# Patient Record
Sex: Male | Born: 1945
Health system: Southern US, Community
[De-identification: ages and names within clinical notes are randomized; demographics above are authoritative.]

## PROBLEM LIST (undated history)

## (undated) DIAGNOSIS — C61 Malignant neoplasm of prostate: Secondary | ICD-10-CM

## (undated) HISTORY — PX: APPENDECTOMY: SHX54

## (undated) HISTORY — PX: VASECTOMY: SHX75

## (undated) HISTORY — PX: HERNIA REPAIR: SHX51

## (undated) HISTORY — PX: PROSTATE BIOPSY: SHX241

## (undated) HISTORY — PX: COLONOSCOPY W/ POLYPECTOMY: SHX1380

---

## 1998-08-14 ENCOUNTER — Other Ambulatory Visit: Admission: RE | Admit: 1998-08-14 | Discharge: 1998-08-14 | Payer: Self-pay | Admitting: Internal Medicine

## 1998-11-13 ENCOUNTER — Ambulatory Visit (HOSPITAL_COMMUNITY): Admission: RE | Admit: 1998-11-13 | Discharge: 1998-11-13 | Payer: Self-pay | Admitting: Gastroenterology

## 2003-12-04 ENCOUNTER — Ambulatory Visit (HOSPITAL_COMMUNITY): Admission: RE | Admit: 2003-12-04 | Discharge: 2003-12-04 | Payer: Self-pay | Admitting: Gastroenterology

## 2005-11-12 ENCOUNTER — Ambulatory Visit (HOSPITAL_BASED_OUTPATIENT_CLINIC_OR_DEPARTMENT_OTHER): Admission: RE | Admit: 2005-11-12 | Discharge: 2005-11-12 | Payer: Self-pay | Admitting: Surgery

## 2011-11-11 DIAGNOSIS — Z1331 Encounter for screening for depression: Secondary | ICD-10-CM | POA: Diagnosis not present

## 2011-11-11 DIAGNOSIS — H6120 Impacted cerumen, unspecified ear: Secondary | ICD-10-CM | POA: Diagnosis not present

## 2011-11-11 DIAGNOSIS — R351 Nocturia: Secondary | ICD-10-CM | POA: Diagnosis not present

## 2011-11-11 DIAGNOSIS — N400 Enlarged prostate without lower urinary tract symptoms: Secondary | ICD-10-CM | POA: Diagnosis not present

## 2011-11-11 DIAGNOSIS — M1710 Unilateral primary osteoarthritis, unspecified knee: Secondary | ICD-10-CM | POA: Diagnosis not present

## 2011-11-11 DIAGNOSIS — Z79899 Other long term (current) drug therapy: Secondary | ICD-10-CM | POA: Diagnosis not present

## 2011-11-11 DIAGNOSIS — Z0000 Encounter for general adult medical examination without abnormal findings: Secondary | ICD-10-CM | POA: Diagnosis not present

## 2012-10-16 DIAGNOSIS — D1801 Hemangioma of skin and subcutaneous tissue: Secondary | ICD-10-CM | POA: Diagnosis not present

## 2012-10-16 DIAGNOSIS — L821 Other seborrheic keratosis: Secondary | ICD-10-CM | POA: Diagnosis not present

## 2012-10-16 DIAGNOSIS — L723 Sebaceous cyst: Secondary | ICD-10-CM | POA: Diagnosis not present

## 2012-11-16 DIAGNOSIS — Z79899 Other long term (current) drug therapy: Secondary | ICD-10-CM | POA: Diagnosis not present

## 2012-11-16 DIAGNOSIS — Z1331 Encounter for screening for depression: Secondary | ICD-10-CM | POA: Diagnosis not present

## 2012-11-16 DIAGNOSIS — R351 Nocturia: Secondary | ICD-10-CM | POA: Diagnosis not present

## 2012-11-16 DIAGNOSIS — M1710 Unilateral primary osteoarthritis, unspecified knee: Secondary | ICD-10-CM | POA: Diagnosis not present

## 2012-11-16 DIAGNOSIS — N400 Enlarged prostate without lower urinary tract symptoms: Secondary | ICD-10-CM | POA: Diagnosis not present

## 2012-11-16 DIAGNOSIS — Z0000 Encounter for general adult medical examination without abnormal findings: Secondary | ICD-10-CM | POA: Diagnosis not present

## 2012-12-06 DIAGNOSIS — L723 Sebaceous cyst: Secondary | ICD-10-CM | POA: Diagnosis not present

## 2012-12-22 DIAGNOSIS — H5210 Myopia, unspecified eye: Secondary | ICD-10-CM | POA: Diagnosis not present

## 2013-11-21 DIAGNOSIS — N400 Enlarged prostate without lower urinary tract symptoms: Secondary | ICD-10-CM | POA: Diagnosis not present

## 2013-11-21 DIAGNOSIS — Z1331 Encounter for screening for depression: Secondary | ICD-10-CM | POA: Diagnosis not present

## 2013-11-21 DIAGNOSIS — M1710 Unilateral primary osteoarthritis, unspecified knee: Secondary | ICD-10-CM | POA: Diagnosis not present

## 2013-11-21 DIAGNOSIS — Z23 Encounter for immunization: Secondary | ICD-10-CM | POA: Diagnosis not present

## 2013-11-21 DIAGNOSIS — Z0000 Encounter for general adult medical examination without abnormal findings: Secondary | ICD-10-CM | POA: Diagnosis not present

## 2013-11-21 DIAGNOSIS — R351 Nocturia: Secondary | ICD-10-CM | POA: Diagnosis not present

## 2013-11-21 DIAGNOSIS — IMO0002 Reserved for concepts with insufficient information to code with codable children: Secondary | ICD-10-CM | POA: Diagnosis not present

## 2014-02-14 ENCOUNTER — Other Ambulatory Visit: Payer: Self-pay | Admitting: Gastroenterology

## 2014-05-09 ENCOUNTER — Encounter (HOSPITAL_COMMUNITY): Payer: Self-pay | Admitting: *Deleted

## 2014-05-13 ENCOUNTER — Other Ambulatory Visit: Payer: Self-pay | Admitting: Gastroenterology

## 2014-05-28 ENCOUNTER — Encounter (HOSPITAL_COMMUNITY)
Admission: RE | Disposition: A | Discharge status: Home / Self Care | Payer: Self-pay | Source: Ambulatory Visit | Attending: Gastroenterology

## 2014-05-28 ENCOUNTER — Encounter (HOSPITAL_COMMUNITY): Payer: Self-pay | Admitting: Certified Registered Nurse Anesthetist

## 2014-05-28 ENCOUNTER — Ambulatory Visit (HOSPITAL_COMMUNITY): Payer: Medicare Other | Admitting: Anesthesiology

## 2014-05-28 ENCOUNTER — Ambulatory Visit (HOSPITAL_COMMUNITY)
Admission: RE | Admit: 2014-05-28 | Discharge: 2014-05-28 | Disposition: A | Discharge status: Home / Self Care | Payer: Medicare Other | Source: Ambulatory Visit | Attending: Gastroenterology | Admitting: Gastroenterology

## 2014-05-28 DIAGNOSIS — N400 Enlarged prostate without lower urinary tract symptoms: Secondary | ICD-10-CM | POA: Insufficient documentation

## 2014-05-28 DIAGNOSIS — Z1211 Encounter for screening for malignant neoplasm of colon: Secondary | ICD-10-CM | POA: Insufficient documentation

## 2014-05-28 DIAGNOSIS — K635 Polyp of colon: Secondary | ICD-10-CM | POA: Insufficient documentation

## 2014-05-28 DIAGNOSIS — M1990 Unspecified osteoarthritis, unspecified site: Secondary | ICD-10-CM | POA: Insufficient documentation

## 2014-05-28 DIAGNOSIS — Z86010 Personal history of colonic polyps: Secondary | ICD-10-CM | POA: Diagnosis not present

## 2014-05-28 DIAGNOSIS — D125 Benign neoplasm of sigmoid colon: Secondary | ICD-10-CM | POA: Diagnosis not present

## 2014-05-28 HISTORY — PX: COLONOSCOPY WITH PROPOFOL: SHX5780

## 2014-05-28 SURGERY — COLONOSCOPY WITH PROPOFOL
Anesthesia: Monitor Anesthesia Care

## 2014-05-28 MED ORDER — SODIUM CHLORIDE 0.9 % IV SOLN: INTRAVENOUS | Status: DC

## 2014-05-28 MED ORDER — GLYCOPYRROLATE 0.2 MG/ML IJ SOLN
INTRAMUSCULAR | Status: DC | PRN
  Administered 2014-05-28: 0.2 mg via INTRAVENOUS

## 2014-05-28 MED ORDER — LACTATED RINGERS IV SOLN
INTRAVENOUS | Status: DC
Start: 2014-05-28 — End: 2014-05-28
  Administered 2014-05-28: 1000 mL via INTRAVENOUS

## 2014-05-28 MED ORDER — PROPOFOL 10 MG/ML IV BOLUS
INTRAVENOUS | Status: AC
  Filled 2014-05-28: qty 20

## 2014-05-28 MED ORDER — PROPOFOL INFUSION 10 MG/ML OPTIME
INTRAVENOUS | Status: DC | PRN
  Administered 2014-05-28: 300 ug/kg/min via INTRAVENOUS

## 2014-05-28 MED ORDER — LIDOCAINE HCL (CARDIAC) 20 MG/ML IV SOLN
INTRAVENOUS | Status: DC | PRN
  Administered 2014-05-28: 100 mg via INTRAVENOUS

## 2014-05-28 MED ORDER — LIDOCAINE HCL (CARDIAC) 20 MG/ML IV SOLN
INTRAVENOUS | Status: AC
  Filled 2014-05-28: qty 5

## 2014-05-28 MED ORDER — ONDANSETRON HCL 4 MG/2ML IJ SOLN
INTRAMUSCULAR | Status: DC | PRN
  Administered 2014-05-28: 4 mg via INTRAVENOUS

## 2014-05-28 MED ORDER — KETAMINE HCL 10 MG/ML IJ SOLN
INTRAMUSCULAR | Status: DC | PRN
Start: 2014-05-28 — End: 2014-05-28
  Administered 2014-05-28: 20 mg via INTRAVENOUS

## 2014-05-28 MED ORDER — GLYCOPYRROLATE 0.2 MG/ML IJ SOLN
INTRAMUSCULAR | Status: AC
  Filled 2014-05-28: qty 1

## 2014-05-28 MED ORDER — ONDANSETRON HCL 4 MG/2ML IJ SOLN
INTRAMUSCULAR | Status: AC
  Filled 2014-05-28: qty 2

## 2014-05-28 SURGICAL SUPPLY — 21 items

## 2014-05-28 NOTE — Op Note (Signed)
Procedure: Surveillance colonoscopy. Adenomatous colon polyps removed colonoscopically in 2000. Normal surveillance colonoscopies performed on 12/04/2003 and 01/02/2009  Endoscopist: Earle Gell  Premedication: Propofol administered by anesthesia  Procedure: The patient was placed in the left lateral decubitus position. Anal inspection and digital rectal exam were normal. The Pentax pediatric colonoscope was introduced into the rectum and advanced to the cecum. A normal-appearing appendiceal orifice was identified. A normal-appearing ileocecal valve was intubated and the terminal ileum inspected. Colonic preparation for the exam today was good  Rectum. Normal. Retroflexed view of the distal rectum normal  Sigmoid colon. From the distal sigmoid colon, a 3 mm sessile polyp was removed with the cold biopsy forceps  Descending colon. Normal  Splenic flexure. Normal  Transverse colon. Normal  Hepatic flexure. Normal  Ascending colon. Normal  Cecum and ileocecal valve. Normal  Terminal ileum. Normal  Assessment: From the distal sigmoid colon, a diminutive polyp was removed with the cold biopsy forceps; otherwise normal surveillance colonoscopy  Recommendation: Schedule repeat surveillance colonoscopy in 5 years

## 2014-05-28 NOTE — Anesthesia Postprocedure Evaluation (Signed)
  Anesthesia Post-op Note  Patient: Rodney Blevins  Procedure(s) Performed: Procedure(s) (LRB): COLONOSCOPY WITH PROPOFOL (N/A)  Patient Location: PACU  Anesthesia Type: MAC  Level of Consciousness: awake and alert   Airway and Oxygen Therapy: Patient Spontanous Breathing  Post-op Pain: mild  Post-op Assessment: Post-op Vital signs reviewed, Patient's Cardiovascular Status Stable, Respiratory Function Stable, Patent Airway and No signs of Nausea or vomiting  Last Vitals:  Filed Vitals:   05/28/14 1000  BP: 147/76  Pulse: 52  Temp:   Resp: 19    Post-op Vital Signs: stable   Complications: No apparent anesthesia complications

## 2014-05-28 NOTE — Anesthesia Preprocedure Evaluation (Addendum)
Anesthesia Evaluation  Patient identified by MRN, date of birth, ID band Patient awake    Reviewed: Allergy & Precautions, H&P , NPO status , Patient's Chart, lab work & pertinent test results  Airway Mallampati: II  TM Distance: >3 FB Neck ROM: full    Dental no notable dental hx. (+) Teeth Intact, Dental Advisory Given   Pulmonary neg pulmonary ROS,  breath sounds clear to auscultation  Pulmonary exam normal       Cardiovascular Exercise Tolerance: Good negative cardio ROS  Rhythm:regular Rate:Normal     Neuro/Psych negative neurological ROS  negative psych ROS   GI/Hepatic negative GI ROS, Neg liver ROS,   Endo/Other  negative endocrine ROS  Renal/GU negative Renal ROS  negative genitourinary   Musculoskeletal   Abdominal   Peds  Hematology negative hematology ROS (+)   Anesthesia Other Findings   Reproductive/Obstetrics negative OB ROS                            Anesthesia Physical Anesthesia Plan  ASA: I  Anesthesia Plan: MAC   Post-op Pain Management:    Induction:   Airway Management Planned: Simple Face Mask  Additional Equipment:   Intra-op Plan:   Post-operative Plan:   Informed Consent: I have reviewed the patients History and Physical, chart, labs and discussed the procedure including the risks, benefits and alternatives for the proposed anesthesia with the patient or authorized representative who has indicated his/her understanding and acceptance.   Dental Advisory Given  Plan Discussed with: CRNA and Surgeon  Anesthesia Plan Comments:         Anesthesia Quick Evaluation

## 2014-05-28 NOTE — Transfer of Care (Signed)
Immediate Anesthesia Transfer of Care Note  Patient: Rodney Blevins  Procedure(s) Performed: Procedure(s) (LRB): COLONOSCOPY WITH PROPOFOL (N/A)  Patient Location: PACU  Anesthesia Type: MAC  Level of Consciousness: sedated, patient cooperative and responds to stimulation  Airway & Oxygen Therapy: Patient Spontanous Breathing and Patient connected to face mask oxgen  Post-op Assessment: Report given to PACU RN and Post -op Vital signs reviewed and stable  Post vital signs: Reviewed and stable  Complications: No apparent anesthesia complications

## 2014-05-28 NOTE — H&P (Signed)
  Procedure: Surveillance colonoscopy. History of adenomatous colon polyps removed colonoscopically in 2000. Normal surveillance colonoscopies performed on 12/04/2003 and 01/02/2009  History: The patient is a 68 year old male born 04/13/1946. He is scheduled to undergo a surveillance colonoscopy with polypectomy to prevent colon cancer.  Medication allergies: None  Past medical history: Perforated appendix. Osteoarthritis. Benign prostatic hypertrophy. Appendectomy. Hernia repair.  Exam: The patient is alert and lying comfortably on the endoscopy stretcher. Abdomen is soft and nontender to palpation. Lungs are clear to auscultation. Cardiac exam reveals a regular rhythm.  Plan: Proceed with surveillance colonoscopy

## 2014-05-29 ENCOUNTER — Encounter (HOSPITAL_COMMUNITY): Payer: Self-pay | Admitting: Gastroenterology

## 2014-12-20 DIAGNOSIS — Z86010 Personal history of colonic polyps: Secondary | ICD-10-CM | POA: Diagnosis not present

## 2014-12-20 DIAGNOSIS — R351 Nocturia: Secondary | ICD-10-CM | POA: Diagnosis not present

## 2014-12-20 DIAGNOSIS — R252 Cramp and spasm: Secondary | ICD-10-CM | POA: Diagnosis not present

## 2014-12-20 DIAGNOSIS — H6123 Impacted cerumen, bilateral: Secondary | ICD-10-CM | POA: Diagnosis not present

## 2014-12-20 DIAGNOSIS — Z23 Encounter for immunization: Secondary | ICD-10-CM | POA: Diagnosis not present

## 2014-12-20 DIAGNOSIS — Z1389 Encounter for screening for other disorder: Secondary | ICD-10-CM | POA: Diagnosis not present

## 2014-12-20 DIAGNOSIS — H6120 Impacted cerumen, unspecified ear: Secondary | ICD-10-CM | POA: Diagnosis not present

## 2014-12-20 DIAGNOSIS — Z79899 Other long term (current) drug therapy: Secondary | ICD-10-CM | POA: Diagnosis not present

## 2014-12-20 DIAGNOSIS — M179 Osteoarthritis of knee, unspecified: Secondary | ICD-10-CM | POA: Diagnosis not present

## 2014-12-20 DIAGNOSIS — N400 Enlarged prostate without lower urinary tract symptoms: Secondary | ICD-10-CM | POA: Diagnosis not present

## 2014-12-20 DIAGNOSIS — Z136 Encounter for screening for cardiovascular disorders: Secondary | ICD-10-CM | POA: Diagnosis not present

## 2014-12-20 DIAGNOSIS — E780 Pure hypercholesterolemia: Secondary | ICD-10-CM | POA: Diagnosis not present

## 2014-12-20 DIAGNOSIS — Z0001 Encounter for general adult medical examination with abnormal findings: Secondary | ICD-10-CM | POA: Diagnosis not present

## 2014-12-30 DIAGNOSIS — H2513 Age-related nuclear cataract, bilateral: Secondary | ICD-10-CM | POA: Diagnosis not present

## 2014-12-30 DIAGNOSIS — H52203 Unspecified astigmatism, bilateral: Secondary | ICD-10-CM | POA: Diagnosis not present

## 2016-02-23 DIAGNOSIS — M179 Osteoarthritis of knee, unspecified: Secondary | ICD-10-CM | POA: Diagnosis not present

## 2016-02-23 DIAGNOSIS — N400 Enlarged prostate without lower urinary tract symptoms: Secondary | ICD-10-CM | POA: Diagnosis not present

## 2016-02-23 DIAGNOSIS — Z125 Encounter for screening for malignant neoplasm of prostate: Secondary | ICD-10-CM | POA: Diagnosis not present

## 2016-02-23 DIAGNOSIS — R252 Cramp and spasm: Secondary | ICD-10-CM | POA: Diagnosis not present

## 2016-02-23 DIAGNOSIS — R351 Nocturia: Secondary | ICD-10-CM | POA: Diagnosis not present

## 2016-02-23 DIAGNOSIS — Z1389 Encounter for screening for other disorder: Secondary | ICD-10-CM | POA: Diagnosis not present

## 2016-02-23 DIAGNOSIS — E785 Hyperlipidemia, unspecified: Secondary | ICD-10-CM | POA: Diagnosis not present

## 2016-02-23 DIAGNOSIS — Z79899 Other long term (current) drug therapy: Secondary | ICD-10-CM | POA: Diagnosis not present

## 2016-02-23 DIAGNOSIS — Z0001 Encounter for general adult medical examination with abnormal findings: Secondary | ICD-10-CM | POA: Diagnosis not present

## 2016-02-23 DIAGNOSIS — Z86010 Personal history of colonic polyps: Secondary | ICD-10-CM | POA: Diagnosis not present

## 2017-01-10 DIAGNOSIS — H2513 Age-related nuclear cataract, bilateral: Secondary | ICD-10-CM | POA: Diagnosis not present

## 2017-01-10 DIAGNOSIS — H5213 Myopia, bilateral: Secondary | ICD-10-CM | POA: Diagnosis not present

## 2017-03-29 DIAGNOSIS — Z23 Encounter for immunization: Secondary | ICD-10-CM | POA: Diagnosis not present

## 2017-07-08 DIAGNOSIS — R252 Cramp and spasm: Secondary | ICD-10-CM | POA: Diagnosis not present

## 2017-07-08 DIAGNOSIS — Z86010 Personal history of colonic polyps: Secondary | ICD-10-CM | POA: Diagnosis not present

## 2017-07-08 DIAGNOSIS — Z0000 Encounter for general adult medical examination without abnormal findings: Secondary | ICD-10-CM | POA: Diagnosis not present

## 2017-07-08 DIAGNOSIS — Z0001 Encounter for general adult medical examination with abnormal findings: Secondary | ICD-10-CM | POA: Diagnosis not present

## 2017-07-08 DIAGNOSIS — E559 Vitamin D deficiency, unspecified: Secondary | ICD-10-CM | POA: Diagnosis not present

## 2017-07-08 DIAGNOSIS — E785 Hyperlipidemia, unspecified: Secondary | ICD-10-CM | POA: Diagnosis not present

## 2017-07-08 DIAGNOSIS — N400 Enlarged prostate without lower urinary tract symptoms: Secondary | ICD-10-CM | POA: Diagnosis not present

## 2017-07-08 DIAGNOSIS — Z79899 Other long term (current) drug therapy: Secondary | ICD-10-CM | POA: Diagnosis not present

## 2017-07-08 DIAGNOSIS — Z1389 Encounter for screening for other disorder: Secondary | ICD-10-CM | POA: Diagnosis not present

## 2017-07-08 DIAGNOSIS — R351 Nocturia: Secondary | ICD-10-CM | POA: Diagnosis not present

## 2017-07-08 DIAGNOSIS — M179 Osteoarthritis of knee, unspecified: Secondary | ICD-10-CM | POA: Diagnosis not present

## 2018-04-11 DIAGNOSIS — Z23 Encounter for immunization: Secondary | ICD-10-CM | POA: Diagnosis not present

## 2018-07-20 DIAGNOSIS — Z79899 Other long term (current) drug therapy: Secondary | ICD-10-CM | POA: Diagnosis not present

## 2018-07-20 DIAGNOSIS — M179 Osteoarthritis of knee, unspecified: Secondary | ICD-10-CM | POA: Diagnosis not present

## 2018-07-20 DIAGNOSIS — N400 Enlarged prostate without lower urinary tract symptoms: Secondary | ICD-10-CM | POA: Diagnosis not present

## 2018-07-20 DIAGNOSIS — R351 Nocturia: Secondary | ICD-10-CM | POA: Diagnosis not present

## 2018-07-20 DIAGNOSIS — H6123 Impacted cerumen, bilateral: Secondary | ICD-10-CM | POA: Diagnosis not present

## 2018-07-20 DIAGNOSIS — Z1389 Encounter for screening for other disorder: Secondary | ICD-10-CM | POA: Diagnosis not present

## 2018-07-20 DIAGNOSIS — Z86010 Personal history of colonic polyps: Secondary | ICD-10-CM | POA: Diagnosis not present

## 2018-07-20 DIAGNOSIS — Z0001 Encounter for general adult medical examination with abnormal findings: Secondary | ICD-10-CM | POA: Diagnosis not present

## 2018-07-20 DIAGNOSIS — E785 Hyperlipidemia, unspecified: Secondary | ICD-10-CM | POA: Diagnosis not present

## 2018-07-20 DIAGNOSIS — R252 Cramp and spasm: Secondary | ICD-10-CM | POA: Diagnosis not present

## 2018-07-20 DIAGNOSIS — E559 Vitamin D deficiency, unspecified: Secondary | ICD-10-CM | POA: Diagnosis not present

## 2018-08-11 DIAGNOSIS — N419 Inflammatory disease of prostate, unspecified: Secondary | ICD-10-CM | POA: Diagnosis not present

## 2018-11-07 DIAGNOSIS — R972 Elevated prostate specific antigen [PSA]: Secondary | ICD-10-CM | POA: Diagnosis not present

## 2019-03-13 DIAGNOSIS — H5213 Myopia, bilateral: Secondary | ICD-10-CM | POA: Diagnosis not present

## 2019-03-13 DIAGNOSIS — H2513 Age-related nuclear cataract, bilateral: Secondary | ICD-10-CM | POA: Diagnosis not present

## 2019-03-19 DIAGNOSIS — Z23 Encounter for immunization: Secondary | ICD-10-CM | POA: Diagnosis not present

## 2019-07-08 ENCOUNTER — Ambulatory Visit: Payer: Medicare Other

## 2019-07-26 ENCOUNTER — Ambulatory Visit: Payer: Medicare Other

## 2019-08-09 DIAGNOSIS — Z1389 Encounter for screening for other disorder: Secondary | ICD-10-CM | POA: Diagnosis not present

## 2019-08-09 DIAGNOSIS — R252 Cramp and spasm: Secondary | ICD-10-CM | POA: Diagnosis not present

## 2019-08-09 DIAGNOSIS — Z79899 Other long term (current) drug therapy: Secondary | ICD-10-CM | POA: Diagnosis not present

## 2019-08-09 DIAGNOSIS — Z86010 Personal history of colonic polyps: Secondary | ICD-10-CM | POA: Diagnosis not present

## 2019-08-09 DIAGNOSIS — Z0001 Encounter for general adult medical examination with abnormal findings: Secondary | ICD-10-CM | POA: Diagnosis not present

## 2019-08-09 DIAGNOSIS — E559 Vitamin D deficiency, unspecified: Secondary | ICD-10-CM | POA: Diagnosis not present

## 2019-08-09 DIAGNOSIS — E785 Hyperlipidemia, unspecified: Secondary | ICD-10-CM | POA: Diagnosis not present

## 2019-08-09 DIAGNOSIS — H6123 Impacted cerumen, bilateral: Secondary | ICD-10-CM | POA: Diagnosis not present

## 2019-08-09 DIAGNOSIS — R972 Elevated prostate specific antigen [PSA]: Secondary | ICD-10-CM | POA: Diagnosis not present

## 2019-10-09 DIAGNOSIS — R3914 Feeling of incomplete bladder emptying: Secondary | ICD-10-CM | POA: Diagnosis not present

## 2019-10-09 DIAGNOSIS — R972 Elevated prostate specific antigen [PSA]: Secondary | ICD-10-CM | POA: Diagnosis not present

## 2019-10-09 DIAGNOSIS — R350 Frequency of micturition: Secondary | ICD-10-CM | POA: Diagnosis not present

## 2019-10-09 DIAGNOSIS — R351 Nocturia: Secondary | ICD-10-CM | POA: Diagnosis not present

## 2020-01-29 DIAGNOSIS — R972 Elevated prostate specific antigen [PSA]: Secondary | ICD-10-CM | POA: Diagnosis not present

## 2020-01-29 DIAGNOSIS — R351 Nocturia: Secondary | ICD-10-CM | POA: Diagnosis not present

## 2020-02-13 ENCOUNTER — Other Ambulatory Visit: Payer: Self-pay | Admitting: Urology

## 2020-02-13 DIAGNOSIS — R972 Elevated prostate specific antigen [PSA]: Secondary | ICD-10-CM

## 2020-02-25 DIAGNOSIS — D225 Melanocytic nevi of trunk: Secondary | ICD-10-CM | POA: Diagnosis not present

## 2020-02-25 DIAGNOSIS — D1801 Hemangioma of skin and subcutaneous tissue: Secondary | ICD-10-CM | POA: Diagnosis not present

## 2020-02-25 DIAGNOSIS — D485 Neoplasm of uncertain behavior of skin: Secondary | ICD-10-CM | POA: Diagnosis not present

## 2020-02-25 DIAGNOSIS — L814 Other melanin hyperpigmentation: Secondary | ICD-10-CM | POA: Diagnosis not present

## 2020-02-25 DIAGNOSIS — L821 Other seborrheic keratosis: Secondary | ICD-10-CM | POA: Diagnosis not present

## 2020-02-25 DIAGNOSIS — D2262 Melanocytic nevi of left upper limb, including shoulder: Secondary | ICD-10-CM | POA: Diagnosis not present

## 2020-02-25 DIAGNOSIS — D2272 Melanocytic nevi of left lower limb, including hip: Secondary | ICD-10-CM | POA: Diagnosis not present

## 2020-02-25 DIAGNOSIS — D2372 Other benign neoplasm of skin of left lower limb, including hip: Secondary | ICD-10-CM | POA: Diagnosis not present

## 2020-02-25 DIAGNOSIS — L570 Actinic keratosis: Secondary | ICD-10-CM | POA: Diagnosis not present

## 2020-02-27 DIAGNOSIS — Z23 Encounter for immunization: Secondary | ICD-10-CM | POA: Diagnosis not present

## 2020-03-06 ENCOUNTER — Other Ambulatory Visit: Payer: Self-pay | Admitting: Urology

## 2020-03-10 ENCOUNTER — Ambulatory Visit
Admission: RE | Admit: 2020-03-10 | Discharge: 2020-03-10 | Disposition: A | Discharge status: Home / Self Care | Payer: Medicare Other | Source: Ambulatory Visit | Attending: Urology | Admitting: Urology

## 2020-03-10 ENCOUNTER — Other Ambulatory Visit: Payer: Self-pay

## 2020-03-10 DIAGNOSIS — R972 Elevated prostate specific antigen [PSA]: Secondary | ICD-10-CM

## 2020-03-10 MED ORDER — GADOBENATE DIMEGLUMINE 529 MG/ML IV SOLN
19.0000 mL | Freq: Once | INTRAVENOUS | Status: AC | PRN
  Administered 2020-03-10: 19 mL via INTRAVENOUS

## 2020-03-18 DIAGNOSIS — H2513 Age-related nuclear cataract, bilateral: Secondary | ICD-10-CM | POA: Diagnosis not present

## 2020-03-18 DIAGNOSIS — H5213 Myopia, bilateral: Secondary | ICD-10-CM | POA: Diagnosis not present

## 2020-03-24 DIAGNOSIS — Z1159 Encounter for screening for other viral diseases: Secondary | ICD-10-CM | POA: Diagnosis not present

## 2020-03-27 DIAGNOSIS — K621 Rectal polyp: Secondary | ICD-10-CM | POA: Diagnosis not present

## 2020-03-27 DIAGNOSIS — Z86010 Personal history of colonic polyps: Secondary | ICD-10-CM | POA: Diagnosis not present

## 2020-03-27 DIAGNOSIS — K635 Polyp of colon: Secondary | ICD-10-CM | POA: Diagnosis not present

## 2020-03-27 DIAGNOSIS — K644 Residual hemorrhoidal skin tags: Secondary | ICD-10-CM | POA: Diagnosis not present

## 2020-03-27 DIAGNOSIS — K648 Other hemorrhoids: Secondary | ICD-10-CM | POA: Diagnosis not present

## 2020-03-27 DIAGNOSIS — D120 Benign neoplasm of cecum: Secondary | ICD-10-CM | POA: Diagnosis not present

## 2020-03-27 DIAGNOSIS — D125 Benign neoplasm of sigmoid colon: Secondary | ICD-10-CM | POA: Diagnosis not present

## 2020-04-01 DIAGNOSIS — K635 Polyp of colon: Secondary | ICD-10-CM | POA: Diagnosis not present

## 2020-04-01 DIAGNOSIS — D125 Benign neoplasm of sigmoid colon: Secondary | ICD-10-CM | POA: Diagnosis not present

## 2020-04-01 DIAGNOSIS — D120 Benign neoplasm of cecum: Secondary | ICD-10-CM | POA: Diagnosis not present

## 2020-04-01 DIAGNOSIS — K621 Rectal polyp: Secondary | ICD-10-CM | POA: Diagnosis not present

## 2020-04-07 DIAGNOSIS — C61 Malignant neoplasm of prostate: Secondary | ICD-10-CM | POA: Diagnosis not present

## 2020-04-07 DIAGNOSIS — R972 Elevated prostate specific antigen [PSA]: Secondary | ICD-10-CM | POA: Diagnosis not present

## 2020-05-12 DIAGNOSIS — R3914 Feeling of incomplete bladder emptying: Secondary | ICD-10-CM | POA: Diagnosis not present

## 2020-05-12 DIAGNOSIS — R350 Frequency of micturition: Secondary | ICD-10-CM | POA: Diagnosis not present

## 2020-05-12 DIAGNOSIS — C61 Malignant neoplasm of prostate: Secondary | ICD-10-CM | POA: Diagnosis not present

## 2020-05-14 ENCOUNTER — Telehealth: Payer: Self-pay | Admitting: *Deleted

## 2020-05-14 NOTE — Telephone Encounter (Signed)
LVM for call back to schedule appointment with Dr. Tammi Klippel.

## 2020-06-17 ENCOUNTER — Ambulatory Visit: Payer: Medicare Other

## 2020-06-17 ENCOUNTER — Ambulatory Visit: Payer: Medicare Other | Admitting: Radiation Oncology

## 2020-06-17 DIAGNOSIS — C61 Malignant neoplasm of prostate: Secondary | ICD-10-CM | POA: Insufficient documentation

## 2020-06-17 NOTE — Progress Notes (Signed)
Radiation Oncology         [phone]) [phone] ________________________________  Initial outpatient Consultation  Name: Rodney Blevins MRN: [id number]  Date: 06/19/2020  DOB: April 12, 1946  RK:YHCWC, Herbie [place], MD  Ardis Hughs, MD   REFERRING PHYSICIAN: Ardis Hughs, MD  DIAGNOSIS: 75 y.o. gentleman with stage T1c adenocarcinoma of the prostate with a Gleason's score of 4+3 and a PSA of 7.71    ICD-10-CM   1. Cancer of prostate with intermediate recurrence risk (stage T2b-c or Gleason 7 or PSA 10-20) ([place])  C61     HISTORY OF PRESENT ILLNESS::Rodney Blevins is a 75 y.o. gentleman.  He was noted to have an elevated PSA of 5.75 by his primary care physician, Dr. Marcellus Scott on 07/30/19.  Accordingly, he was referred for evaluation in urology by Dr. Louis Meckel on 10/09/19,  digital rectal examination was performed at that time revealing a 40 gm gland with no nodules.  PSA was repeated and found to be 7.14.  He underwent prostate MRI on 03/10/20 demonstrating a 1.9 cm PI-RADS-5 lesion in the anterior right paramidline central gland at the level of the apex.    The patient proceeded to MRI-fusion guided transrectal ultrasound with 13 biopsies of the prostate targeting the MRI lesion on 04/07/20.  The prostate volume measured 72 cc.  Out of 16 core biopsies, 1 was positive.  The maximum Gleason score was 4+3, and this was seen in 32% of one of the four MRI Region of Interest specimens.  The patient reviewed the biopsy results with his urologist and he has kindly been referred today for discussion of potential radiation treatment options.  PREVIOUS RADIATION THERAPY: No  PAST MEDICAL HISTORY:  has no past medical history on file.    PAST SURGICAL HISTORY: Past Surgical History:  Procedure Laterality Date  . APPENDECTOMY    . COLONOSCOPY W/ POLYPECTOMY     x3  . COLONOSCOPY WITH PROPOFOL N/A 05/28/2014   Procedure: COLONOSCOPY WITH PROPOFOL;  Surgeon: Garlan Fair, MD;  Location: [facility]  ENDOSCOPY;  Service: Endoscopy;  Laterality: N/A;  . HERNIA REPAIR     groin  . VASECTOMY      FAMILY HISTORY: family history is not on file.  SOCIAL HISTORY:  reports that he has never smoked. He does not have any smokeless tobacco history on file. He reports that he does not drink alcohol and does not use drugs.  ALLERGIES: Patient has no known allergies.  MEDICATIONS:  Current Outpatient Medications  Medication Sig Dispense Refill  . ibuprofen (ADVIL,MOTRIN) 200 MG tablet Take 400 mg by mouth every 6 (six) hours as needed for mild pain or moderate pain.    . Multiple Vitamin (MULTIVITAMIN WITH MINERALS) TABS tablet Take 1 tablet by mouth daily at 3 pm.    . POTASSIUM PO Take 1 tablet by mouth daily as needed (muscle cramps).     No current facility-administered medications for this visit.    REVIEW OF SYSTEMS:  A 15 point review of systems is documented in the electronic medical record. This was obtained by the nursing staff. However, I reviewed this with the patient to discuss relevant findings and make appropriate changes.  Pertinent items noted in HPI and remainder of comprehensive ROS otherwise negative.  The patient completed an IPSS and IIEF questionnaire.  His IPSS score was 24 indicating severe urinary outflow obstructive symptoms.  His SHIM score was 23, indicating that he has mild ED and able to complete sexual activity most of the  time.   PHYSICAL EXAM: In general this is a well appearing Caucasian male in no acute distress.  He's alert and oriented x4 and appropriate throughout the examination. Cardiopulmonary assessment is negative for acute distress and he exhibits normal effort.   KPS = 90  100 - Normal; no complaints; no evidence of disease. 90   - Able to carry on normal activity; minor signs or symptoms of disease. 80   - Normal activity with effort; some signs or symptoms of disease. 74   - Cares for self; unable to carry on normal activity or to do active  work. 60   - Requires occasional assistance, but is able to care for most of his personal needs. 50   - Requires considerable assistance and frequent medical care. 60   - Disabled; requires special care and assistance. 70   - Severely disabled; hospital admission is indicated although death not imminent. 22   - Very sick; hospital admission necessary; active supportive treatment necessary. 10   - Moribund; fatal processes progressing rapidly. 0     - Dead  Karnofsky DA, Abelmann WH, Craver LS and Burchenal JH [phone]) The use of the nitrogen mustards in the palliative treatment of carcinoma: with particular reference to bronchogenic carcinoma Cancer 1 [phone]   LABORATORY DATA:  No results found for: WBC, HGB, HCT, MCV, PLT No results found for: NA, K, CL, CO2 No results found for: ALT, AST, GGT, ALKPHOS, BILITOT   RADIOGRAPHY: No results found.    IMPRESSION: This gentleman is a 75 y.o. gentleman with stage T1c adenocarcinoma of the prostate with a Gleason's score of 4+3 and a PSA of 7.71.  His Gleason's Score puts him into the unfavorable intermediate risk group.  Accordingly he is eligible for a variety of potential treatment options including prostatectomy, IMRT and potentially brachytherapy.  His age may influence consideration for surgery.  The anterior apical location of this prostate cancer may not be ideal for brachytherapy.  PLAN:Today I reviewed the findings and workup thus far.  We discussed the natural history of prostate cancer.  We reviewed the the implications of T-stage, Gleason's Score, and PSA on decision-making and outcomes in prostate cancer.  We discussed radiation treatment in the management of prostate cancer with regard to the logistics and delivery of external beam radiation treatment as well as the logistics and delivery of prostate brachytherapy.  We compared and contrasted each of these approaches and also compared these against prostatectomy.  The patient expressed  interest in external beam radiotherapy.  I filled out a patient counseling form for him with relevant treatment diagrams and we retained a copy for our records.   Prior to treatment, the patient is interested in staging bone scan and CT abd/pelvis for staging/baseline.  I spoke with Dr. Louis Meckel, who will set those up.  Pending no new findings on radiology imaging, the patient would like to proceed with prostate IMRT.  We can move forward with scheduling placement of three gold fiducial markers into the prostate to proceed with IMRT in the near future.     I enjoyed meeting with him today, and will look forward to participating in the care of this very nice gentleman.   I spent 60 minutes for the total encounter with preparation, face-to-face and documentation/correspondence.   ------------------------------------------------  Sheral Apley Tammi Klippel, M.D.

## 2020-06-18 ENCOUNTER — Encounter: Payer: Self-pay | Admitting: Radiation Oncology

## 2020-06-18 DIAGNOSIS — Z860100 Personal history of colon polyps, unspecified: Secondary | ICD-10-CM | POA: Insufficient documentation

## 2020-06-18 DIAGNOSIS — M1710 Unilateral primary osteoarthritis, unspecified knee: Secondary | ICD-10-CM | POA: Insufficient documentation

## 2020-06-18 DIAGNOSIS — R252 Cramp and spasm: Secondary | ICD-10-CM | POA: Insufficient documentation

## 2020-06-18 DIAGNOSIS — R351 Nocturia: Secondary | ICD-10-CM | POA: Insufficient documentation

## 2020-06-18 DIAGNOSIS — E785 Hyperlipidemia, unspecified: Secondary | ICD-10-CM | POA: Insufficient documentation

## 2020-06-18 DIAGNOSIS — N400 Enlarged prostate without lower urinary tract symptoms: Secondary | ICD-10-CM | POA: Insufficient documentation

## 2020-06-18 DIAGNOSIS — Z79899 Other long term (current) drug therapy: Secondary | ICD-10-CM | POA: Insufficient documentation

## 2020-06-18 DIAGNOSIS — R030 Elevated blood-pressure reading, without diagnosis of hypertension: Secondary | ICD-10-CM | POA: Insufficient documentation

## 2020-06-18 DIAGNOSIS — K4090 Unilateral inguinal hernia, without obstruction or gangrene, not specified as recurrent: Secondary | ICD-10-CM | POA: Insufficient documentation

## 2020-06-18 DIAGNOSIS — R972 Elevated prostate specific antigen [PSA]: Secondary | ICD-10-CM | POA: Insufficient documentation

## 2020-06-18 DIAGNOSIS — E559 Vitamin D deficiency, unspecified: Secondary | ICD-10-CM | POA: Insufficient documentation

## 2020-06-18 DIAGNOSIS — M179 Osteoarthritis of knee, unspecified: Secondary | ICD-10-CM | POA: Insufficient documentation

## 2020-06-18 DIAGNOSIS — H6120 Impacted cerumen, unspecified ear: Secondary | ICD-10-CM | POA: Insufficient documentation

## 2020-06-18 DIAGNOSIS — Z0000 Encounter for general adult medical examination without abnormal findings: Secondary | ICD-10-CM | POA: Insufficient documentation

## 2020-06-18 NOTE — Progress Notes (Signed)
GU Location of Tumor / Histology: prostatic adenocarcinoma  If Prostate Cancer, Gleason Score is (4 + 3) and PSA is (7.71). Prostate volume: 72 gm.  Rodney Blevins presented with an elevated PSA of 5.75 discovered by his PCP on 07/30/2019.   Biopsies of prostate (if applicable) revealed:    Past/Anticipated interventions by urology, if any: prostate biopsy, prescribed tamsulosin, referred to Dr. Kathrynn Running  Past/Anticipated interventions by medical oncology, if any: no  Weight changes, if any: no  Bowel/Bladder complaints, if any: IPSS 24. SHIM 23. Reports a weak urine stream, incomplete emptying and urinary frequency. Denies any bowel complaints.  Reports minor dysuria. Denies hematuria. Denies urinary leakage or incontinence.  Nausea/Vomiting, if any: no  Pain issues, if any:  denies  SAFETY ISSUES:  Prior radiation? denies  Pacemaker/ICD? denies  Possible current pregnancy? no, male patient  Is the patient on methotrexate? denies  Current Complaints / other details:  75 year old male. Resides in [place]. Married with 1 daughter and 1 son. Mother with hx of breast cancer.

## 2020-06-19 ENCOUNTER — Ambulatory Visit
Admission: RE | Admit: 2020-06-19 | Discharge: 2020-06-19 | Disposition: A | Discharge status: Home / Self Care | Payer: Medicare Other | Source: Ambulatory Visit | Attending: Radiation Oncology | Admitting: Radiation Oncology

## 2020-06-19 ENCOUNTER — Encounter: Payer: Self-pay | Admitting: Radiation Oncology

## 2020-06-19 ENCOUNTER — Other Ambulatory Visit: Payer: Self-pay

## 2020-06-19 ENCOUNTER — Encounter: Payer: Self-pay | Admitting: Medical Oncology

## 2020-06-19 VITALS — BP 175/81 | HR 53 | Temp 97.3°F | Resp 18 | Ht 73.5 in | Wt 198.4 lb

## 2020-06-19 DIAGNOSIS — Z791 Long term (current) use of non-steroidal anti-inflammatories (NSAID): Secondary | ICD-10-CM | POA: Diagnosis not present

## 2020-06-19 DIAGNOSIS — R972 Elevated prostate specific antigen [PSA]: Secondary | ICD-10-CM | POA: Diagnosis not present

## 2020-06-19 DIAGNOSIS — C61 Malignant neoplasm of prostate: Secondary | ICD-10-CM

## 2020-06-19 HISTORY — DX: Malignant neoplasm of prostate: C61

## 2020-06-30 ENCOUNTER — Other Ambulatory Visit: Payer: Self-pay | Admitting: Urology

## 2020-06-30 ENCOUNTER — Other Ambulatory Visit (HOSPITAL_COMMUNITY): Payer: Self-pay | Admitting: Urology

## 2020-06-30 DIAGNOSIS — C61 Malignant neoplasm of prostate: Secondary | ICD-10-CM

## 2020-07-08 DIAGNOSIS — C61 Malignant neoplasm of prostate: Secondary | ICD-10-CM | POA: Diagnosis not present

## 2020-07-09 ENCOUNTER — Encounter (HOSPITAL_COMMUNITY)
Admission: RE | Admit: 2020-07-09 | Discharge: 2020-07-09 | Disposition: A | Discharge status: Home / Self Care | Payer: Medicare Other | Source: Ambulatory Visit | Attending: Urology | Admitting: Urology

## 2020-07-09 ENCOUNTER — Other Ambulatory Visit: Payer: Self-pay

## 2020-07-09 DIAGNOSIS — N400 Enlarged prostate without lower urinary tract symptoms: Secondary | ICD-10-CM | POA: Diagnosis not present

## 2020-07-09 DIAGNOSIS — C61 Malignant neoplasm of prostate: Secondary | ICD-10-CM | POA: Insufficient documentation

## 2020-07-09 MED ORDER — TECHNETIUM TC 99M MEDRONATE IV KIT: 21.5000 | PACK | Freq: Once | INTRAVENOUS | Status: DC | PRN

## 2020-08-14 DIAGNOSIS — C61 Malignant neoplasm of prostate: Secondary | ICD-10-CM | POA: Diagnosis not present

## 2020-08-18 ENCOUNTER — Telehealth: Payer: Self-pay | Admitting: *Deleted

## 2020-08-18 ENCOUNTER — Telehealth: Payer: Self-pay | Admitting: Radiation Oncology

## 2020-08-18 NOTE — Telephone Encounter (Signed)
Received call from patient's wife, Vaughan Basta, requesting a return call. Phoned back to inquire. She questions if her husband needs to have a full or empty bladder for his simulation appointment. Explained he should have a comfortably full bladder for his simulation and daily treatment. Encouraged Mrs. Kozel to have her husband drink a full bottle of water on the way to his appointment and not empty his bladder until after his simulation/daily treatment. She verbalized understanding and appreciation for the return call.

## 2020-08-18 NOTE — Telephone Encounter (Signed)
CALLED PATIENT TO REMIND OF SIM APPT. FOR 08-19-20- ARRIVAL TIME- 9:15 AM, SPOKE WITH PATIENT AND HE IS AWARE OF THESE APPTS.

## 2020-08-19 ENCOUNTER — Encounter: Payer: Self-pay | Admitting: Medical Oncology

## 2020-08-19 ENCOUNTER — Other Ambulatory Visit: Payer: Self-pay

## 2020-08-19 ENCOUNTER — Ambulatory Visit
Admission: RE | Admit: 2020-08-19 | Discharge: 2020-08-19 | Disposition: A | Discharge status: Home / Self Care | Payer: Medicare Other | Source: Ambulatory Visit | Attending: Radiation Oncology | Admitting: Radiation Oncology

## 2020-08-19 DIAGNOSIS — C61 Malignant neoplasm of prostate: Secondary | ICD-10-CM | POA: Insufficient documentation

## 2020-08-19 NOTE — Progress Notes (Signed)
  Radiation Oncology         [phone]) [phone] ________________________________  Name: Rodney Blevins MRN: [id number]  Date: 08/19/2020  DOB: [birth year]  SIMULATION AND TREATMENT PLANNING NOTE    ICD-10-CM   1. Cancer of prostate with intermediate recurrence risk (stage T2b-c or Gleason 7 or PSA 10-20) ([place])  C61     DIAGNOSIS:  75 y.o. gentleman with stage T1c adenocarcinoma of the prostate with a Gleason's score of 4+3 and a PSA of 7.71  NARRATIVE:  The patient was brought to the [facility].  Identity was confirmed.  All relevant records and images related to the planned course of therapy were reviewed.  The patient freely provided informed written consent to proceed with treatment after reviewing the details related to the planned course of therapy. The consent form was witnessed and verified by the simulation staff.  Then, the patient was set-up in a stable reproducible supine position for radiation therapy.  A vacuum lock pillow device was custom fabricated to position his legs in a reproducible immobilized position.  Then, I performed a urethrogram under sterile conditions to identify the prostatic apex.  CT images were obtained.  Surface markings were placed.  The CT images were loaded into the planning software.  Then the prostate target and avoidance structures including the rectum, bladder, bowel and hips were contoured.  Treatment planning then occurred.  The radiation prescription was entered and confirmed.  A total of one complex treatment devices was fabricated. I have requested : Intensity Modulated Radiotherapy (IMRT) is medically necessary for this case for the following reason:  Rectal sparing.Marland Kitchen  PLAN:  The patient will receive 70 Gy in 28 fractions.  ________________________________  Sheral Apley Tammi Klippel, M.D.

## 2020-08-21 DIAGNOSIS — C61 Malignant neoplasm of prostate: Secondary | ICD-10-CM | POA: Diagnosis not present

## 2020-08-21 DIAGNOSIS — N401 Enlarged prostate with lower urinary tract symptoms: Secondary | ICD-10-CM | POA: Diagnosis not present

## 2020-08-21 DIAGNOSIS — H6123 Impacted cerumen, bilateral: Secondary | ICD-10-CM | POA: Diagnosis not present

## 2020-08-21 DIAGNOSIS — Z86010 Personal history of colonic polyps: Secondary | ICD-10-CM | POA: Diagnosis not present

## 2020-08-21 DIAGNOSIS — Z79899 Other long term (current) drug therapy: Secondary | ICD-10-CM | POA: Diagnosis not present

## 2020-08-21 DIAGNOSIS — E785 Hyperlipidemia, unspecified: Secondary | ICD-10-CM | POA: Diagnosis not present

## 2020-08-21 DIAGNOSIS — R972 Elevated prostate specific antigen [PSA]: Secondary | ICD-10-CM | POA: Diagnosis not present

## 2020-08-21 DIAGNOSIS — E559 Vitamin D deficiency, unspecified: Secondary | ICD-10-CM | POA: Diagnosis not present

## 2020-08-21 DIAGNOSIS — Z0001 Encounter for general adult medical examination with abnormal findings: Secondary | ICD-10-CM | POA: Diagnosis not present

## 2020-08-25 DIAGNOSIS — C61 Malignant neoplasm of prostate: Secondary | ICD-10-CM | POA: Diagnosis not present

## 2020-08-28 ENCOUNTER — Ambulatory Visit
Admission: RE | Admit: 2020-08-28 | Discharge: 2020-08-28 | Disposition: A | Discharge status: Home / Self Care | Payer: Medicare Other | Source: Ambulatory Visit | Attending: Radiation Oncology | Admitting: Radiation Oncology

## 2020-08-28 ENCOUNTER — Other Ambulatory Visit: Payer: Self-pay

## 2020-08-28 DIAGNOSIS — C61 Malignant neoplasm of prostate: Secondary | ICD-10-CM | POA: Diagnosis not present

## 2020-08-29 ENCOUNTER — Other Ambulatory Visit: Payer: Self-pay

## 2020-08-29 ENCOUNTER — Ambulatory Visit
Admission: RE | Admit: 2020-08-29 | Discharge: 2020-08-29 | Disposition: A | Discharge status: Home / Self Care | Payer: Medicare Other | Source: Ambulatory Visit | Attending: Radiation Oncology | Admitting: Radiation Oncology

## 2020-08-29 DIAGNOSIS — C61 Malignant neoplasm of prostate: Secondary | ICD-10-CM | POA: Diagnosis not present

## 2020-09-01 ENCOUNTER — Other Ambulatory Visit: Payer: Self-pay

## 2020-09-01 ENCOUNTER — Ambulatory Visit
Admission: RE | Admit: 2020-09-01 | Discharge: 2020-09-01 | Disposition: A | Discharge status: Home / Self Care | Payer: Medicare Other | Source: Ambulatory Visit | Attending: Radiation Oncology | Admitting: Radiation Oncology

## 2020-09-01 DIAGNOSIS — C61 Malignant neoplasm of prostate: Secondary | ICD-10-CM | POA: Diagnosis not present

## 2020-09-02 ENCOUNTER — Ambulatory Visit
Admission: RE | Admit: 2020-09-02 | Discharge: 2020-09-02 | Disposition: A | Discharge status: Home / Self Care | Payer: Medicare Other | Source: Ambulatory Visit | Attending: Radiation Oncology | Admitting: Radiation Oncology

## 2020-09-02 DIAGNOSIS — C61 Malignant neoplasm of prostate: Secondary | ICD-10-CM | POA: Diagnosis not present

## 2020-09-03 ENCOUNTER — Ambulatory Visit: Payer: Medicare Other

## 2020-09-04 ENCOUNTER — Ambulatory Visit
Admission: RE | Admit: 2020-09-04 | Discharge: 2020-09-04 | Disposition: A | Discharge status: Home / Self Care | Payer: Medicare Other | Source: Ambulatory Visit | Attending: Radiation Oncology | Admitting: Radiation Oncology

## 2020-09-04 DIAGNOSIS — C61 Malignant neoplasm of prostate: Secondary | ICD-10-CM | POA: Diagnosis not present

## 2020-09-05 ENCOUNTER — Other Ambulatory Visit: Payer: Self-pay

## 2020-09-05 ENCOUNTER — Ambulatory Visit
Admission: RE | Admit: 2020-09-05 | Discharge: 2020-09-05 | Disposition: A | Discharge status: Home / Self Care | Payer: Medicare Other | Source: Ambulatory Visit | Attending: Radiation Oncology | Admitting: Radiation Oncology

## 2020-09-05 DIAGNOSIS — C61 Malignant neoplasm of prostate: Secondary | ICD-10-CM | POA: Diagnosis not present

## 2020-09-08 ENCOUNTER — Ambulatory Visit
Admission: RE | Admit: 2020-09-08 | Discharge: 2020-09-08 | Disposition: A | Discharge status: Home / Self Care | Payer: Medicare Other | Source: Ambulatory Visit | Attending: Radiation Oncology | Admitting: Radiation Oncology

## 2020-09-08 ENCOUNTER — Other Ambulatory Visit: Payer: Self-pay

## 2020-09-08 DIAGNOSIS — C61 Malignant neoplasm of prostate: Secondary | ICD-10-CM | POA: Diagnosis not present

## 2020-09-09 ENCOUNTER — Ambulatory Visit
Admission: RE | Admit: 2020-09-09 | Discharge: 2020-09-09 | Disposition: A | Discharge status: Home / Self Care | Payer: Medicare Other | Source: Ambulatory Visit | Attending: Radiation Oncology | Admitting: Radiation Oncology

## 2020-09-09 DIAGNOSIS — C61 Malignant neoplasm of prostate: Secondary | ICD-10-CM | POA: Diagnosis not present

## 2020-09-10 ENCOUNTER — Ambulatory Visit
Admission: RE | Admit: 2020-09-10 | Discharge: 2020-09-10 | Disposition: A | Discharge status: Home / Self Care | Payer: Medicare Other | Source: Ambulatory Visit | Attending: Radiation Oncology | Admitting: Radiation Oncology

## 2020-09-10 ENCOUNTER — Other Ambulatory Visit: Payer: Self-pay

## 2020-09-10 DIAGNOSIS — C61 Malignant neoplasm of prostate: Secondary | ICD-10-CM | POA: Diagnosis not present

## 2020-09-11 ENCOUNTER — Ambulatory Visit
Admission: RE | Admit: 2020-09-11 | Discharge: 2020-09-11 | Disposition: A | Discharge status: Home / Self Care | Payer: Medicare Other | Source: Ambulatory Visit | Attending: Radiation Oncology | Admitting: Radiation Oncology

## 2020-09-11 DIAGNOSIS — C61 Malignant neoplasm of prostate: Secondary | ICD-10-CM | POA: Diagnosis not present

## 2020-09-12 ENCOUNTER — Other Ambulatory Visit: Payer: Self-pay

## 2020-09-12 ENCOUNTER — Ambulatory Visit
Admission: RE | Admit: 2020-09-12 | Discharge: 2020-09-12 | Disposition: A | Discharge status: Home / Self Care | Payer: Medicare Other | Source: Ambulatory Visit | Attending: Radiation Oncology | Admitting: Radiation Oncology

## 2020-09-12 DIAGNOSIS — C61 Malignant neoplasm of prostate: Secondary | ICD-10-CM | POA: Diagnosis not present

## 2020-09-15 ENCOUNTER — Other Ambulatory Visit: Payer: Self-pay

## 2020-09-15 ENCOUNTER — Ambulatory Visit
Admission: RE | Admit: 2020-09-15 | Discharge: 2020-09-15 | Disposition: A | Discharge status: Home / Self Care | Payer: Medicare Other | Source: Ambulatory Visit | Attending: Radiation Oncology | Admitting: Radiation Oncology

## 2020-09-15 DIAGNOSIS — C61 Malignant neoplasm of prostate: Secondary | ICD-10-CM | POA: Diagnosis not present

## 2020-09-16 ENCOUNTER — Ambulatory Visit
Admission: RE | Admit: 2020-09-16 | Discharge: 2020-09-16 | Disposition: A | Discharge status: Home / Self Care | Payer: Medicare Other | Source: Ambulatory Visit | Attending: Radiation Oncology | Admitting: Radiation Oncology

## 2020-09-16 DIAGNOSIS — C61 Malignant neoplasm of prostate: Secondary | ICD-10-CM | POA: Diagnosis not present

## 2020-09-17 ENCOUNTER — Ambulatory Visit
Admission: RE | Admit: 2020-09-17 | Discharge: 2020-09-17 | Disposition: A | Discharge status: Home / Self Care | Payer: Medicare Other | Source: Ambulatory Visit | Attending: Radiation Oncology | Admitting: Radiation Oncology

## 2020-09-17 ENCOUNTER — Other Ambulatory Visit: Payer: Self-pay

## 2020-09-17 DIAGNOSIS — C61 Malignant neoplasm of prostate: Secondary | ICD-10-CM | POA: Diagnosis not present

## 2020-09-18 ENCOUNTER — Ambulatory Visit
Admission: RE | Admit: 2020-09-18 | Discharge: 2020-09-18 | Disposition: A | Discharge status: Home / Self Care | Payer: Medicare Other | Source: Ambulatory Visit | Attending: Radiation Oncology | Admitting: Radiation Oncology

## 2020-09-18 DIAGNOSIS — C61 Malignant neoplasm of prostate: Secondary | ICD-10-CM | POA: Diagnosis not present

## 2020-09-19 ENCOUNTER — Ambulatory Visit
Admission: RE | Admit: 2020-09-19 | Discharge: 2020-09-19 | Disposition: A | Discharge status: Home / Self Care | Payer: Medicare Other | Source: Ambulatory Visit | Attending: Radiation Oncology | Admitting: Radiation Oncology

## 2020-09-19 ENCOUNTER — Other Ambulatory Visit: Payer: Self-pay

## 2020-09-19 DIAGNOSIS — C61 Malignant neoplasm of prostate: Secondary | ICD-10-CM | POA: Diagnosis not present

## 2020-09-22 ENCOUNTER — Ambulatory Visit
Admission: RE | Admit: 2020-09-22 | Discharge: 2020-09-22 | Disposition: A | Discharge status: Home / Self Care | Payer: Medicare Other | Source: Ambulatory Visit | Attending: Radiation Oncology | Admitting: Radiation Oncology

## 2020-09-22 ENCOUNTER — Other Ambulatory Visit: Payer: Self-pay

## 2020-09-22 DIAGNOSIS — C61 Malignant neoplasm of prostate: Secondary | ICD-10-CM | POA: Diagnosis not present

## 2020-09-23 ENCOUNTER — Ambulatory Visit
Admission: RE | Admit: 2020-09-23 | Discharge: 2020-09-23 | Disposition: A | Discharge status: Home / Self Care | Payer: Medicare Other | Source: Ambulatory Visit | Attending: Radiation Oncology | Admitting: Radiation Oncology

## 2020-09-23 DIAGNOSIS — C61 Malignant neoplasm of prostate: Secondary | ICD-10-CM | POA: Diagnosis not present

## 2020-09-24 ENCOUNTER — Other Ambulatory Visit: Payer: Self-pay

## 2020-09-24 ENCOUNTER — Ambulatory Visit
Admission: RE | Admit: 2020-09-24 | Discharge: 2020-09-24 | Disposition: A | Discharge status: Home / Self Care | Payer: Medicare Other | Source: Ambulatory Visit | Attending: Radiation Oncology | Admitting: Radiation Oncology

## 2020-09-24 DIAGNOSIS — C61 Malignant neoplasm of prostate: Secondary | ICD-10-CM | POA: Diagnosis not present

## 2020-09-25 ENCOUNTER — Ambulatory Visit
Admission: RE | Admit: 2020-09-25 | Discharge: 2020-09-25 | Disposition: A | Discharge status: Home / Self Care | Payer: Medicare Other | Source: Ambulatory Visit | Attending: Radiation Oncology | Admitting: Radiation Oncology

## 2020-09-25 DIAGNOSIS — C61 Malignant neoplasm of prostate: Secondary | ICD-10-CM | POA: Diagnosis not present

## 2020-09-26 ENCOUNTER — Ambulatory Visit
Admission: RE | Admit: 2020-09-26 | Discharge: 2020-09-26 | Disposition: A | Discharge status: Home / Self Care | Payer: Medicare Other | Source: Ambulatory Visit | Attending: Radiation Oncology | Admitting: Radiation Oncology

## 2020-09-26 ENCOUNTER — Other Ambulatory Visit: Payer: Self-pay

## 2020-09-26 DIAGNOSIS — C61 Malignant neoplasm of prostate: Secondary | ICD-10-CM | POA: Diagnosis not present

## 2020-09-29 ENCOUNTER — Ambulatory Visit
Admission: RE | Admit: 2020-09-29 | Discharge: 2020-09-29 | Disposition: A | Discharge status: Home / Self Care | Payer: Medicare Other | Source: Ambulatory Visit | Attending: Radiation Oncology | Admitting: Radiation Oncology

## 2020-09-29 ENCOUNTER — Other Ambulatory Visit: Payer: Self-pay

## 2020-09-29 DIAGNOSIS — C61 Malignant neoplasm of prostate: Secondary | ICD-10-CM | POA: Insufficient documentation

## 2020-09-30 ENCOUNTER — Ambulatory Visit
Admission: RE | Admit: 2020-09-30 | Discharge: 2020-09-30 | Disposition: A | Discharge status: Home / Self Care | Payer: Medicare Other | Source: Ambulatory Visit | Attending: Radiation Oncology | Admitting: Radiation Oncology

## 2020-09-30 ENCOUNTER — Other Ambulatory Visit: Payer: Self-pay

## 2020-09-30 DIAGNOSIS — C61 Malignant neoplasm of prostate: Secondary | ICD-10-CM | POA: Diagnosis not present

## 2020-10-01 ENCOUNTER — Ambulatory Visit
Admission: RE | Admit: 2020-10-01 | Discharge: 2020-10-01 | Disposition: A | Discharge status: Home / Self Care | Payer: Medicare Other | Source: Ambulatory Visit | Attending: Radiation Oncology | Admitting: Radiation Oncology

## 2020-10-01 DIAGNOSIS — C61 Malignant neoplasm of prostate: Secondary | ICD-10-CM | POA: Diagnosis not present

## 2020-10-02 ENCOUNTER — Other Ambulatory Visit: Payer: Self-pay

## 2020-10-02 ENCOUNTER — Ambulatory Visit
Admission: RE | Admit: 2020-10-02 | Discharge: 2020-10-02 | Disposition: A | Discharge status: Home / Self Care | Payer: Medicare Other | Source: Ambulatory Visit | Attending: Radiation Oncology | Admitting: Radiation Oncology

## 2020-10-02 DIAGNOSIS — C61 Malignant neoplasm of prostate: Secondary | ICD-10-CM | POA: Diagnosis not present

## 2020-10-03 ENCOUNTER — Ambulatory Visit
Admission: RE | Admit: 2020-10-03 | Discharge: 2020-10-03 | Disposition: A | Discharge status: Home / Self Care | Payer: Medicare Other | Source: Ambulatory Visit | Attending: Radiation Oncology | Admitting: Radiation Oncology

## 2020-10-03 DIAGNOSIS — C61 Malignant neoplasm of prostate: Secondary | ICD-10-CM | POA: Diagnosis not present

## 2020-10-06 ENCOUNTER — Ambulatory Visit
Admission: RE | Admit: 2020-10-06 | Discharge: 2020-10-06 | Disposition: A | Discharge status: Home / Self Care | Payer: Medicare Other | Source: Ambulatory Visit | Attending: Radiation Oncology | Admitting: Radiation Oncology

## 2020-10-06 ENCOUNTER — Other Ambulatory Visit: Payer: Self-pay

## 2020-10-06 ENCOUNTER — Ambulatory Visit: Payer: Medicare Other

## 2020-10-06 DIAGNOSIS — C61 Malignant neoplasm of prostate: Secondary | ICD-10-CM | POA: Diagnosis not present

## 2020-10-07 ENCOUNTER — Ambulatory Visit
Admission: RE | Admit: 2020-10-07 | Discharge: 2020-10-07 | Disposition: A | Discharge status: Home / Self Care | Payer: Medicare Other | Source: Ambulatory Visit | Attending: Radiation Oncology | Admitting: Radiation Oncology

## 2020-10-07 ENCOUNTER — Encounter: Payer: Self-pay | Admitting: Urology

## 2020-10-07 DIAGNOSIS — C61 Malignant neoplasm of prostate: Secondary | ICD-10-CM | POA: Diagnosis not present

## 2020-11-18 ENCOUNTER — Encounter: Payer: Self-pay | Admitting: Urology

## 2020-11-18 NOTE — Progress Notes (Signed)
Patient due to for follow up visit after receiving radiation to prostate. Meaningful use questions complete. Made patient aware that visit on 11/19/20 will be a telephone visit patient  voiced understanding. AUA score is 13.

## 2020-11-19 ENCOUNTER — Ambulatory Visit
Admission: RE | Admit: 2020-11-19 | Discharge: 2020-11-19 | Disposition: A | Discharge status: Home / Self Care | Payer: Medicare Other | Source: Ambulatory Visit | Attending: Urology | Admitting: Urology

## 2020-11-19 ENCOUNTER — Other Ambulatory Visit: Payer: Self-pay

## 2020-11-19 DIAGNOSIS — C61 Malignant neoplasm of prostate: Secondary | ICD-10-CM

## 2020-11-19 NOTE — Progress Notes (Signed)
  Radiation Oncology         [phone]) [phone] ________________________________  Name: Rodney Blevins MRN: [id number]  Date: 10/07/2020  DOB: [birth year]  End of Treatment Note  Diagnosis:   75 y.o. gentleman with stage T1c adenocarcinoma of the prostate with a Gleason's score of 4+3 and a PSA of 7.71     Indication for treatment:  Curative, Definitive Radiotherapy       Radiation treatment dates:   08/28/20 - 10/07/20  Site/dose:   The prostate was treated to 70 Gy in 28 fractions of 2.5 Gy  Beams/energy:   The patient was treated with IMRT using volumetric arc therapy delivering 6 MV X-rays to clockwise and counterclockwise circumferential arcs with a 90 degree collimator offset to avoid dose scalloping.  Image guidance was performed with daily cone beam CT prior to each fraction to align to gold markers in the prostate and assure proper bladder and rectal fill volumes.  Immobilization was achieved with [employer] custom mold.  Narrative: The patient tolerated radiation treatment relatively well with only minor urinary irritation and modest fatigue.  He experienced increased nocturia, urgency, frequency, dysuria and occasional hesitancy but denied straining to void.  He also specifically denied gross hematuria, fever, chills or night sweats.  His LUTS were improved with the use of Flomax daily and AZO as needed.  Plan: The patient has completed radiation treatment. He will return to radiation oncology clinic for routine followup in one month. I advised him to call or return sooner if he has any questions or concerns related to his recovery or treatment. ________________________________  Sheral Apley. Tammi Klippel, M.D.

## 2020-11-19 NOTE — Progress Notes (Signed)
Radiation Oncology         [phone]) [phone] ________________________________  Name: Rodney Blevins MRN: [id number]  Date: 11/19/2020  DOB: 02-Jun-1945  Post Treatment Note  CC: Josetta Huddle, MD  Ardis Hughs, MD  Diagnosis:   75 y.o. gentleman with stage T1c adenocarcinoma of the prostate with a Gleason's score of 4+3 and a PSA of 7.71  Interval Since Last Radiation:  6 weeks  08/28/20 - 10/07/20: The prostate was treated to 70 Gy in 28 fractions of 2.5 Gy  Narrative:  I spoke with the patient to conduct his routine scheduled 1 month follow up visit via telephone to spare the patient unnecessary potential exposure in the healthcare setting during the current COVID-19 pandemic.  The patient was notified in advance and gave permission to proceed with this visit format.  He tolerated radiation treatment relatively well with only minor urinary irritation and modest fatigue.  He experienced increased nocturia, urgency, frequency, dysuria and occasional hesitancy but denied straining to void.  He also specifically denied gross hematuria, fever, chills or night sweats.  His LUTS were improved with the use of Flomax daily and AZO as needed.                              On review of systems, the patient states that he is doing very well in general.  His LUTS are gradually improving with a current IPSS score of 13.  He continues with some increased frequency, urgency, occasional mild dysuria at the start of the stream and nocturia but reports that within the last couple of nights, the nocturia has significantly improved, down to 2 times per night which he is quite pleased with.  He continues taking Flomax daily and AZO as needed.  He denies any abdominal pain, nausea, vomiting, diarrhea or constipation.  He reports a healthy appetite and is maintaining his weight.  His energy level is also gradually returning and he has been able to remain active.  Overall, he is quite pleased with his progress to  date.  ALLERGIES:  has No Known Allergies.  Meds: Current Outpatient Medications  Medication Sig Dispense Refill   Cholecalciferol (VITAMIN D3) 25 MCG (1000 UT) CAPS 1 capsule     Multiple Vitamin (MULTIVITAMIN WITH MINERALS) TABS tablet Take 1 tablet by mouth daily at 3 pm.     tamsulosin (FLOMAX) 0.4 MG CAPS capsule Take 0.4 mg by mouth.     POTASSIUM PO Take 1 tablet by mouth daily as needed (muscle cramps).     No current facility-administered medications for this encounter.    Physical Findings:  vitals were not taken for this visit.  Pain Assessment Pain Score: 0-No pain/0 Unable to assess due to telephone follow-up visit format.  Lab Findings: No results found for: WBC, HGB, HCT, MCV, PLT   Radiographic Findings: No results found.  Impression/Plan: 1. 75 y.o. gentleman with stage T1c adenocarcinoma of the prostate with a Gleason's score of 4+3 and a PSA of 7.71. He will continue to follow up with urology for ongoing PSA determinations but does not currently have an appointment scheduled with Dr. Louis Meckel.  I advised him to call the urology office to schedule a visit for sometime in August 2022 if he has not heard from Dr. Louis Meckel staff within the next week.  He understands what to expect with regards to PSA monitoring going forward. I will look forward to following his response to  treatment via correspondence with urology, and would be happy to continue to participate in his care if clinically indicated. I talked to the patient about what to expect in the future, including his risk for erectile dysfunction and rectal bleeding. I encouraged him to call or return to the office if he has any questions regarding his previous radiation or possible radiation side effects. He was comfortable with this plan and will follow up as needed.     Nicholos Johns, PA-C

## 2021-01-27 DIAGNOSIS — C61 Malignant neoplasm of prostate: Secondary | ICD-10-CM | POA: Diagnosis not present

## 2021-02-03 DIAGNOSIS — R3914 Feeling of incomplete bladder emptying: Secondary | ICD-10-CM | POA: Diagnosis not present

## 2021-02-03 DIAGNOSIS — C61 Malignant neoplasm of prostate: Secondary | ICD-10-CM | POA: Diagnosis not present

## 2021-02-03 DIAGNOSIS — R350 Frequency of micturition: Secondary | ICD-10-CM | POA: Diagnosis not present

## 2021-03-03 DIAGNOSIS — Z23 Encounter for immunization: Secondary | ICD-10-CM | POA: Diagnosis not present

## 2021-03-24 DIAGNOSIS — H2513 Age-related nuclear cataract, bilateral: Secondary | ICD-10-CM | POA: Diagnosis not present

## 2021-03-24 DIAGNOSIS — H52203 Unspecified astigmatism, bilateral: Secondary | ICD-10-CM | POA: Diagnosis not present

## 2021-07-02 IMAGING — MR MR PROSTATE WO/W CM
12 series · 48 of 48 positions shown · IV contrast (19 ml multihance)
Comparison: None.

CLINICAL DATA: Elevated PSA of 7.7 on 01/10/2020.  No biopsy.

EXAM:
MR PROSTATE WITHOUT AND WITH CONTRAST
TECHNIQUE: Multiplanar multisequence MRI images were obtained of the pelvis
centered about the prostate. Pre and post contrast images were
obtained.
CONTRAST:  19mL MULTIHANCE GADOBENATE DIMEGLUMINE 529 MG/ML IV SOLN

[Series 3: T2 · coronal · 3.0mm · 0.56mm/px · 1 of 24 slices shown (1 of 3)]
[im 1/24]
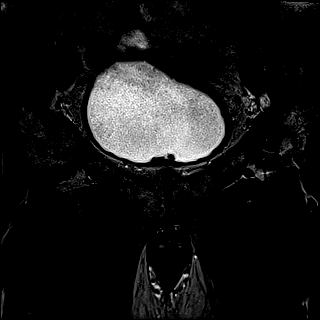

[Series 4: T1 · axial · 5.0mm · 1.25mm/px · 1 of 80 slices shown]
[im 1/80]
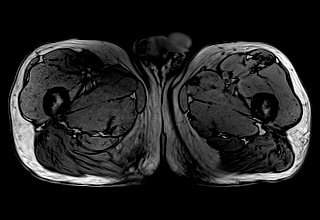

[Series 5: DWI · axial · 3.0mm · 1.75mm/px · z∈[+12,+93]mm · 2 of 84 slices shown (1 of 3)]
[im 1/84]
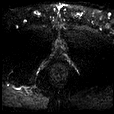
[im 84/84]
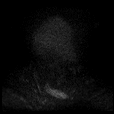

[Series 6: DWI · axial · 3.0mm · 1.75mm/px · 1 of 28 slices shown (2 of 3)]
[im 1/28]
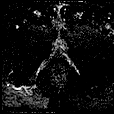

[Series 7: DWI · axial · 3.0mm · 1.75mm/px · 1 of 28 slices shown (3 of 3)]
[im 1/28]
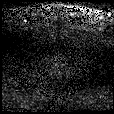

[Series 8: T2 · axial · 3.0mm · 0.56mm/px · 1 of 27 slices shown (2 of 3)]
[im 1/27]
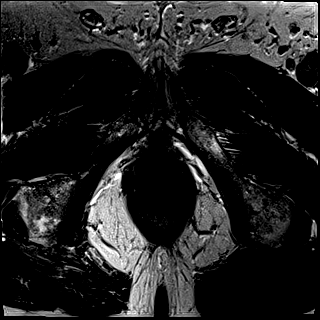

[Series 9: T2 · axial · 1.0mm · 1.04mm/px · z∈[+9,+96]mm · 2 of 88 slices shown (3 of 3)]
[im 1/88]
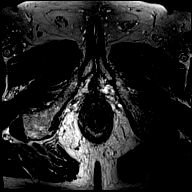
[im 88/88]
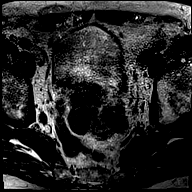

[Series 10: pre t1_twist_tra_dyn · axial · non-contrast · 3.5mm · 0.83mm/px · 1 of 24 slices shown]
[im 1/24]
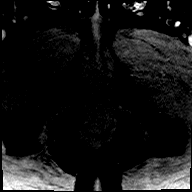

[Series 11: post t1_twist_tra_dyn-copy center · axial · non-contrast · 3.5mm · 0.83mm/px · z∈[+16,+89]mm · 17 of 660 slices shown]
[im 1/660]
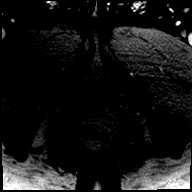
[im 42/660]
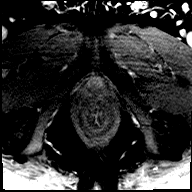
[im 83/660]
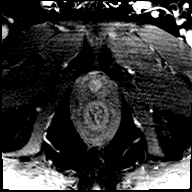
[im 124/660]
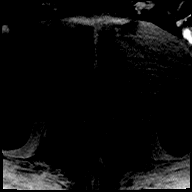
[im 165/660]
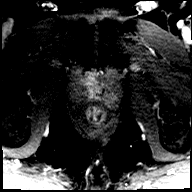
[im 206/660]
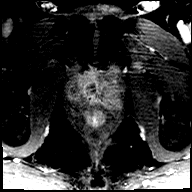
[im 248/660]
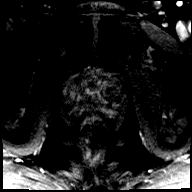
[im 289/660]
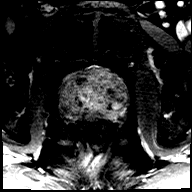
[im 330/660]
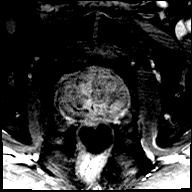
[im 371/660]
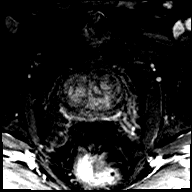
[im 412/660]
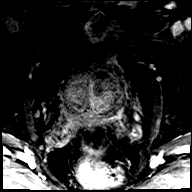
[im 454/660]
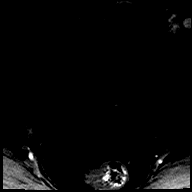
[im 495/660]
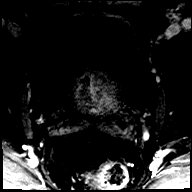
[im 536/660]
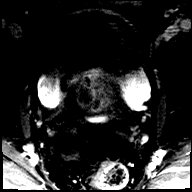
[im 577/660]
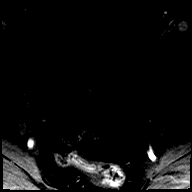
[im 618/660]
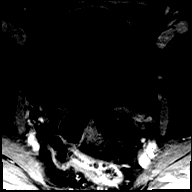
[im 660/660]
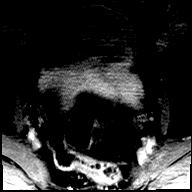

[Series 12: post t1_twist_tra_dyn-copy cent_sub · axial · 3.5mm · 0.83mm/px · z∈[+16,+89]mm · 17 of 631 slices shown]
[im 1/631]
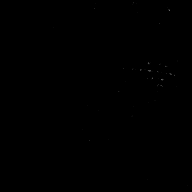
[im 40/631]
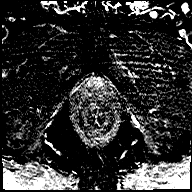
[im 79/631]
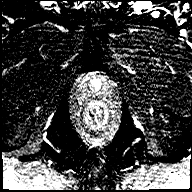
[im 119/631]
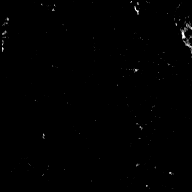
[im 158/631]
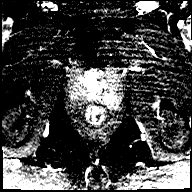
[im 197/631]
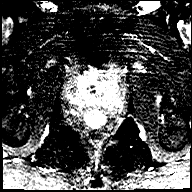
[im 237/631]
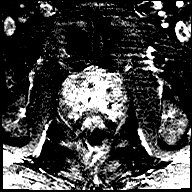
[im 276/631]
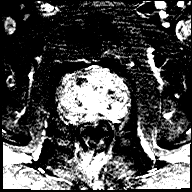
[im 316/631]
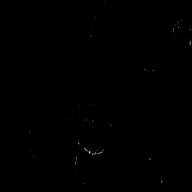
[im 355/631]
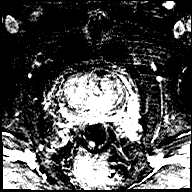
[im 394/631]
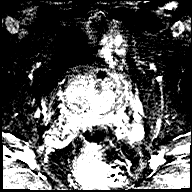
[im 434/631]
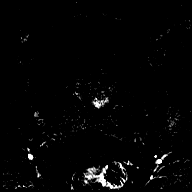
[im 473/631]
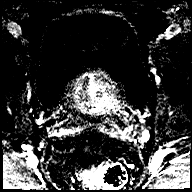
[im 512/631]
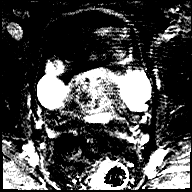
[im 552/631]
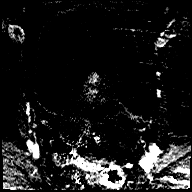
[im 591/631]
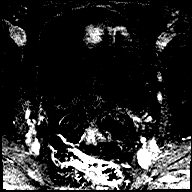
[im 631/631]
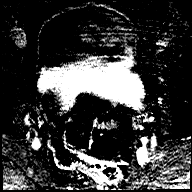

[Series 13: t1_vibe_dixon_tra_f · axial · 2.5mm · 0.91mm/px · z∈[-46,+151]mm · 2 of 80 slices shown]
[im 1/80]
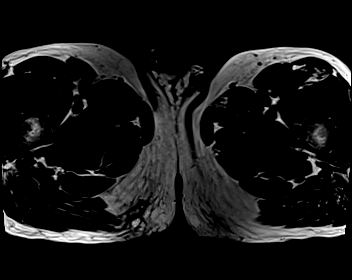
[im 80/80]
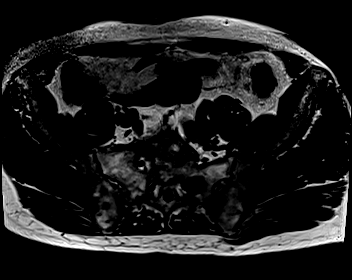

[Series 14: t1_vibe_dixon_tra_w · axial · 2.5mm · 0.91mm/px · z∈[-46,+151]mm · 2 of 80 slices shown]
[im 1/80]
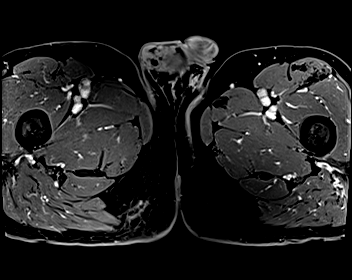
[im 80/80]
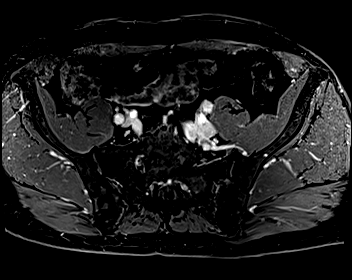

[48 of 48 positions shown; findings below may reference images not displayed]

FINDINGS: Prostate: Moderate central gland enlargement and heterogeneity,
consistent with benign prostatic hyperplasia. Within the anterior
right paramidline central gland, at the level of the apex, is a
x 1.1 cm focus of moderate T2 hypointensity with irregular borders
including on [DATE] and [DATE]. This corresponds to decreased signal on
ADC map [DATE]. PI-RADS(v2.1)-5.

No areas of masslike T2 hypointensity or restricted diffusion within
the peripheral zone. Areas of early post-contrast enhancement within
the left greater than right peripheral zones at the base to mid
gland, including on 144/11 are nonspecific but without correlate on
other pulse sequences.

Volume: 5.7 x 4.3 x 5.1 cm (volume = 65 cm^3)

Transcapsular spread: Suspected. The anterior central gland lesion
contacts the capsule including on [DATE] with suggestion of capsular
bulging including on 62/9.

Seminal vesicle involvement: Absent

Neurovascular bundle involvement: Absent

Pelvic adenopathy: Absent

Bone metastasis: Absent. Heterogeneous marrow signal, favored to be
related to osteopenia.

Other findings: No significant free fluid.  Normal urinary bladder.
IMPRESSION: 1. Right paramidline central gland 1.9 cm nodule at the apex,
suspicious for carcinoma. PI-RADS(v2.1)-5.
2. Possible small volume transcapsular extension.
3. No pelvic adenopathy.

## 2021-07-28 DIAGNOSIS — C61 Malignant neoplasm of prostate: Secondary | ICD-10-CM | POA: Diagnosis not present

## 2021-08-03 DIAGNOSIS — Z8546 Personal history of malignant neoplasm of prostate: Secondary | ICD-10-CM | POA: Diagnosis not present

## 2021-09-02 DIAGNOSIS — C61 Malignant neoplasm of prostate: Secondary | ICD-10-CM | POA: Diagnosis not present

## 2021-09-02 DIAGNOSIS — E559 Vitamin D deficiency, unspecified: Secondary | ICD-10-CM | POA: Diagnosis not present

## 2021-09-02 DIAGNOSIS — Z0001 Encounter for general adult medical examination with abnormal findings: Secondary | ICD-10-CM | POA: Diagnosis not present

## 2021-09-02 DIAGNOSIS — Z86010 Personal history of colonic polyps: Secondary | ICD-10-CM | POA: Diagnosis not present

## 2021-09-02 DIAGNOSIS — R351 Nocturia: Secondary | ICD-10-CM | POA: Diagnosis not present

## 2021-09-02 DIAGNOSIS — Z23 Encounter for immunization: Secondary | ICD-10-CM | POA: Diagnosis not present

## 2021-09-02 DIAGNOSIS — H6120 Impacted cerumen, unspecified ear: Secondary | ICD-10-CM | POA: Diagnosis not present

## 2021-09-02 DIAGNOSIS — N401 Enlarged prostate with lower urinary tract symptoms: Secondary | ICD-10-CM | POA: Diagnosis not present

## 2021-09-02 DIAGNOSIS — Z79899 Other long term (current) drug therapy: Secondary | ICD-10-CM | POA: Diagnosis not present

## 2021-10-07 DIAGNOSIS — Z23 Encounter for immunization: Secondary | ICD-10-CM | POA: Diagnosis not present

## 2022-01-25 DIAGNOSIS — Z8546 Personal history of malignant neoplasm of prostate: Secondary | ICD-10-CM | POA: Diagnosis not present

## 2022-02-02 DIAGNOSIS — R351 Nocturia: Secondary | ICD-10-CM | POA: Diagnosis not present

## 2022-02-02 DIAGNOSIS — Z8546 Personal history of malignant neoplasm of prostate: Secondary | ICD-10-CM | POA: Diagnosis not present

## 2022-02-02 DIAGNOSIS — R350 Frequency of micturition: Secondary | ICD-10-CM | POA: Diagnosis not present

## 2022-03-02 DIAGNOSIS — Z23 Encounter for immunization: Secondary | ICD-10-CM | POA: Diagnosis not present

## 2022-03-25 DIAGNOSIS — H5213 Myopia, bilateral: Secondary | ICD-10-CM | POA: Diagnosis not present

## 2022-03-25 DIAGNOSIS — H2513 Age-related nuclear cataract, bilateral: Secondary | ICD-10-CM | POA: Diagnosis not present

## 2022-03-25 DIAGNOSIS — H52203 Unspecified astigmatism, bilateral: Secondary | ICD-10-CM | POA: Diagnosis not present

## 2022-04-05 DIAGNOSIS — Z03818 Encounter for observation for suspected exposure to other biological agents ruled out: Secondary | ICD-10-CM | POA: Diagnosis not present

## 2022-04-05 DIAGNOSIS — J189 Pneumonia, unspecified organism: Secondary | ICD-10-CM | POA: Diagnosis not present
